# Patient Record
Sex: Female | Born: 2000 | Race: White | Hispanic: No | Marital: Single | Smoking: Current every day smoker
Health system: Southern US, Community
[De-identification: ages and names within clinical notes are randomized; demographics above are authoritative.]

## PROBLEM LIST (undated history)

## (undated) ENCOUNTER — Emergency Department: Payer: No Typology Code available for payment source

## (undated) DIAGNOSIS — D649 Anemia, unspecified: Secondary | ICD-10-CM

## (undated) DIAGNOSIS — F419 Anxiety disorder, unspecified: Secondary | ICD-10-CM

## (undated) HISTORY — PX: WISDOM TOOTH EXTRACTION: SHX21

## (undated) HISTORY — PX: TONSILLECTOMY: SUR1361

## (undated) HISTORY — DX: Anxiety disorder, unspecified: F41.9

## (undated) HISTORY — PX: TYMPANOSTOMY TUBE PLACEMENT: SHX32

---
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
## 2020-03-20 ENCOUNTER — Other Ambulatory Visit: Payer: Self-pay | Admitting: Family Medicine

## 2020-03-20 NOTE — Telephone Encounter (Signed)
Patient needs refill on her perscription.

## 2020-03-20 NOTE — Telephone Encounter (Signed)
Returned TC to patient. LM with number to call.Burt Knack, RN

## 2020-03-22 NOTE — Telephone Encounter (Signed)
Phone call to pt. Left message on voicemail that RN with [facility] is returning phone call, if still need assistance please call us back at [phone]. °

## 2020-03-24 NOTE — Telephone Encounter (Signed)
Attempted TC to patient.  [facility] if still needs assistance.  Richmond Campbell, RN

## 2020-05-03 ENCOUNTER — Ambulatory Visit: Payer: Self-pay | Admitting: Adult Health

## 2020-05-05 NOTE — Progress Notes (Deleted)
No show

## 2020-05-22 ENCOUNTER — Ambulatory Visit: Payer: Medicaid Other

## 2020-07-04 ENCOUNTER — Emergency Department
Admission: EM | Admit: 2020-07-04 | Discharge: 2020-07-04 | Discharge status: Against Medical Advice | Payer: Medicaid Other

## 2020-07-08 ENCOUNTER — Emergency Department
Admission: EM | Admit: 2020-07-08 | Discharge: 2020-07-08 | Disposition: A | Discharge status: Home / Self Care | Payer: Medicaid Other | Attending: Emergency Medicine | Admitting: Emergency Medicine

## 2020-07-08 ENCOUNTER — Encounter: Payer: Self-pay | Admitting: Emergency Medicine

## 2020-07-08 ENCOUNTER — Other Ambulatory Visit: Payer: Self-pay

## 2020-07-08 ENCOUNTER — Emergency Department: Payer: Medicaid Other

## 2020-07-08 DIAGNOSIS — S53402A Unspecified sprain of left elbow, initial encounter: Secondary | ICD-10-CM | POA: Diagnosis not present

## 2020-07-08 DIAGNOSIS — Y999 Unspecified external cause status: Secondary | ICD-10-CM | POA: Insufficient documentation

## 2020-07-08 DIAGNOSIS — R0981 Nasal congestion: Secondary | ICD-10-CM | POA: Diagnosis not present

## 2020-07-08 DIAGNOSIS — S59902A Unspecified injury of left elbow, initial encounter: Secondary | ICD-10-CM | POA: Diagnosis present

## 2020-07-08 DIAGNOSIS — F17210 Nicotine dependence, cigarettes, uncomplicated: Secondary | ICD-10-CM | POA: Insufficient documentation

## 2020-07-08 DIAGNOSIS — W19XXXA Unspecified fall, initial encounter: Secondary | ICD-10-CM | POA: Diagnosis not present

## 2020-07-08 DIAGNOSIS — Y939 Activity, unspecified: Secondary | ICD-10-CM | POA: Diagnosis not present

## 2020-07-08 DIAGNOSIS — J40 Bronchitis, not specified as acute or chronic: Secondary | ICD-10-CM | POA: Insufficient documentation

## 2020-07-08 DIAGNOSIS — Y929 Unspecified place or not applicable: Secondary | ICD-10-CM | POA: Insufficient documentation

## 2020-07-08 DIAGNOSIS — Z79899 Other long term (current) drug therapy: Secondary | ICD-10-CM | POA: Insufficient documentation

## 2020-07-08 MED ORDER — FLUTICASONE PROPIONATE 50 MCG/ACT NA SUSP: 2.0000 | Freq: Every day | NASAL | 0 refills | Status: DC

## 2020-07-08 MED ORDER — PREDNISONE 20 MG PO TABS: 20.0000 mg | ORAL_TABLET | Freq: Two times a day (BID) | ORAL | 0 refills | Status: AC

## 2020-07-08 MED ORDER — BENZONATATE 100 MG PO CAPS: ORAL_CAPSULE | ORAL | 0 refills | Status: DC

## 2020-07-08 NOTE — ED Notes (Signed)
See triage note. Pt here with nasal congestion and cough. Pt also c/o of left elbow pain after injuring it 2 weeks ago.

## 2020-07-08 NOTE — ED Provider Notes (Signed)
[facility] Emergency Department Provider Note ____________________________________________  Time seen: 1047  I have reviewed the triage vital signs and the nursing notes.  HISTORY  Chief Complaint  Cough, Nasal Congestion, and Elbow Pain  HPI [facility] Petko is a 19 y.o. female presents her self to the ED for evaluation of a 2-week complaint of left elbow pain following a mechanical fall.  She also has a 1 week complaint of sinus congestion  and intermittent cough.  Patient denies any interim fever, chills, or sweats.  She also denies any shortness of breath or loss of taste/smell sensation.  She been taking over-the-counter Robitussin with limited benefit.  Past Medical History:  Diagnosis Date  . Anemia   . Anxiety     Patient Active Problem List   Diagnosis Date Noted  . 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 Depression 11/10/2018  . Migraine without aura and without status migrainosus, not intractable 10/05/2014  . Episodic tension-type headache, not intractable 10/05/2014    History reviewed. No pertinent surgical history.  Prior to Admission medications   Medication Sig Start Date End Date Taking? Authorizing Provider  acetaminophen (TYLENOL) 325 MG tablet Take 325 mg by mouth every 6 (six) hours as needed.    [provider]  acyclovir (ZOVIRAX) 800 MG tablet Take 800 mg by mouth daily.    Matt Holmes, [place]  benzonatate (TESSALON PERLES) 100 MG capsule Take 1-2 tabs TID prn cough 07/08/20   Vera Furniss, Charlesetta Ivory, PA-C  fluticasone (FLONASE) 50 MCG/ACT nasal spray Place 2 sprays into both nostrils daily. 07/08/20   [place] Skalicky, Charlesetta Ivory, PA-C  ibuprofen (ADVIL,MOTRIN) 200 MG tablet Take 200 mg by mouth every 6 (six) hours as needed. Takes 2 tablets as needed    [provider]  Magnesium Oxide 500 MG TABS Take by mouth.    [provider]  Naproxen Sodium 220 MG CAPS Take by mouth. Takes one tablet as needed    [provider]  predniSONE (DELTASONE) 20 MG tablet Take 1 tablet (20 mg total) by mouth 2 (two) times daily with a meal for 5 days. 07/08/20 07/13/20  Najat Olazabal, Charlesetta Ivory, PA-C  riboflavin (VITAMIN B-2) 100 MG TABS tablet Take 100 mg by mouth daily.    [provider]  amitriptyline (ELAVIL) 25 MG tablet Take 1 tablet (25 mg total) by mouth at bedtime. Patient not taking: Reported on 05/27/2018 10/05/14 07/08/20  Keturah Shavers, MD    Allergies Sulfa antibiotics  Family History  Problem Relation Age of Onset  . Alcohol abuse Mother   . Drug abuse Mother   . Alcohol abuse Father   . Drug abuse Father   . Anemia Paternal Grandmother   . Lung cancer Paternal Grandfather     Social History Social History   Tobacco Use  . Smoking status: Current Every Day Smoker    Packs/day: 0.50    Types: Cigarettes    Start date: 09/21/2016  . Smokeless tobacco: Never Used  . Tobacco comment: also uses vape  Substance Use Topics  . Alcohol use: No  . Drug use: No    Review of Systems  Constitutional: Negative for fever. Eyes: Negative for visual changes. ENT: Negative for sore throat.  Reports sinus congestion. Cardiovascular: Negative for chest pain. Respiratory: Negative for shortness of breath.  Reports nonproductive cough. Gastrointestinal: Negative for abdominal pain, vomiting and diarrhea. Genitourinary: Negative for dysuria. Musculoskeletal: Negative for back pain.  Reports left elbow pain. Skin:  Negative for rash. Neurological: Negative for headaches, focal weakness or numbness. ____________________________________________  PHYSICAL EXAM:  VITAL SIGNS: ED Triage Vitals [07/08/20 1003]  Enc Vitals Group     BP 109/82     Pulse Rate 72     Resp 18     Temp 97.6 F (36.4 C)     Temp Source Oral     SpO2 100 %     Weight 150 lb (68 kg)     Height 5\' 5"  (1.651 m)     Head Circumference      Peak Flow      Pain Score 6     Pain Loc      Pain Edu?       Excl. in GC?     Constitutional: Alert and oriented. Well appearing and in no distress. Head: Normocephalic and atraumatic. Eyes: Conjunctivae are normal. PERRL. Normal extraocular movements Ears: Canals clear. TMs intact bilaterally. Nose: No congestion/rhinorrhea/epistaxis. Mouth/Throat: Mucous membranes are moist. No oral lesions.  Cardiovascular: Normal rate, regular rhythm. Normal distal pulses. Respiratory: Normal respiratory effort. No wheezes/rales. Mild rhonchi noted bilaterally  Gastrointestinal: Soft and nontender. No distention. Musculoskeletal: Left elbow with minimal swelling.  No joint effusion is appreciated.  Noted derangement, deformity, or dislocation suspected. Normal full ROM, tender with supination. Normal distal grip strength. Nontender with normal range of motion in all extremities.  Neurologic:  CN II-XII grossly intact. Normal gross sensation. No gross focal neurologic deficits are appreciated. Skin:  Skin is warm, dry and intact. No rash noted. ____________________________________________   RADIOLOGY  DG Left Elbow IMPRESSION: Negative. ____________________________________________  PROCEDURES  Jones-Watson splint Sling  Procedures ____________________________________________  INITIAL IMPRESSION / ASSESSMENT AND PLAN / ED COURSE  Patient with ED evaluation of 2 separate complaints.  She gives a 2-week complaint of continued elbow pain and stiffness following mechanical fall.  She also has some reports of some sinus congestion and mild intermittent cough.  No interim fevers reported.  Patient clinically stable without signs of acute respiratory distress, dehydration, or toxic appearance.  X-rays negative for any acute fracture or dislocation.  Patient is placed in a Jones-Watson wrap and a sling to the elbow for comfort.  She will follow-up with Ortho for continued symptoms.  Return precautions have been reviewed.  Prescriptions for prednisone, Flonase,  Tessalon Perles are provided for her symptom relief.  [facility] was evaluated in Emergency Department on 07/08/2020 for the symptoms described in the history of present illness. She was evaluated in the context of the global COVID-19 pandemic, which necessitated consideration that the patient might be at risk for infection with the SARS-CoV-2 virus that causes COVID-19. Institutional protocols and algorithms that pertain to the evaluation of patients at risk for COVID-19 are in a state of rapid change based on information released by regulatory bodies including the CDC and federal and state organizations. These policies and algorithms were followed during the patient's care in the ED. ____________________________________________  FINAL CLINICAL IMPRESSION(S) / ED DIAGNOSES  Final diagnoses:  Bronchitis  Elbow sprain, left, initial encounter      07/10/2020, PA-C 07/08/20 1215    07/10/20, MD 07/08/20 1351

## 2020-07-08 NOTE — ED Triage Notes (Signed)
Pt presents to ED via POV with c/o cough and nasal congestion x 1 week. Pt states has been taking OTC robitussin with some relief. Pt also states L elbow pain after a fall 2 weeks ago, c/o continued pain at this time.

## 2020-07-08 NOTE — ED Notes (Signed)
Declined discharge vital signs. 

## 2020-07-08 NOTE — Discharge Instructions (Addendum)
Your exam and x-ray consistent with a likely bronchitis and an elbow sprain.  No x-ray evidence of a fracture to the elbow.  Wear the elbow support as needed for comfort.  Apply ice as needed.  Take the prescription steroid as needed for cough and elbow inflammation.  Follow-up with Ortho for ongoing symptoms.  Select and see a new primary care provider.  Return as needed.

## 2020-11-20 ENCOUNTER — Emergency Department
Admission: EM | Admit: 2020-11-20 | Discharge: 2020-11-20 | Disposition: A | Discharge status: Home / Self Care | Payer: Medicaid Other | Attending: Student in an Organized Health Care Education/Training Program | Admitting: Student in an Organized Health Care Education/Training Program

## 2020-11-20 ENCOUNTER — Other Ambulatory Visit: Payer: Self-pay

## 2020-11-20 DIAGNOSIS — F17210 Nicotine dependence, cigarettes, uncomplicated: Secondary | ICD-10-CM | POA: Diagnosis not present

## 2020-11-20 DIAGNOSIS — Z20822 Contact with and (suspected) exposure to covid-19: Secondary | ICD-10-CM | POA: Diagnosis not present

## 2020-11-20 DIAGNOSIS — J069 Acute upper respiratory infection, unspecified: Secondary | ICD-10-CM | POA: Insufficient documentation

## 2020-11-20 DIAGNOSIS — R059 Cough, unspecified: Secondary | ICD-10-CM | POA: Diagnosis present

## 2020-11-20 LAB — RESP PANEL BY RT-PCR (FLU A&B, COVID) ARPGX2
Influenza A by PCR: NEGATIVE
Influenza B by PCR: NEGATIVE
SARS Coronavirus 2 by RT PCR: NEGATIVE

## 2020-11-20 MED ORDER — BENZONATATE 100 MG PO CAPS: 100.0000 mg | ORAL_CAPSULE | Freq: Four times a day (QID) | ORAL | 0 refills | Status: AC | PRN

## 2020-11-20 MED ORDER — AMOXICILLIN 875 MG PO TABS: 875.0000 mg | ORAL_TABLET | Freq: Two times a day (BID) | ORAL | 0 refills | Status: DC

## 2020-11-20 NOTE — ED Triage Notes (Signed)
Pt via POV from home. Pt states she has been sick the past two weeks. Pt c/o nasal congestion, intermittent headache, and productive cough. Pt is A&Ox4 and NAD.

## 2020-11-20 NOTE — Discharge Instructions (Addendum)
Follow-up with your regular doctor if not improving in 2 to 3 days.  Return emergency department worsening.  Your Covid test should result later tonight and we will call you with the results.  If negative you may continue with your normal activities.  If positive you will need to quarantine for 10 to 14 days.  Take your medications as prescribed.  Take over-the-counter Mucinex.

## 2020-11-20 NOTE — ED Provider Notes (Signed)
[facility] Emergency Department Provider Note  ____________________________________________   First MD Initiated Contact with Patient 11/20/20 1633     (approximate)  I have reviewed the triage vital signs and the nursing notes.   HISTORY  Chief Complaint Nasal Congestion and Cough    HPI Shawna Griffin is a 19 y.o. female presents emergency department with 2 weeks of cough and congestion.  States nasal mucus has been green.  No chest pain or shortness of breath.  Mild fever.  Has had intermittent headaches.  No vomiting or diarrhea.  No chest pain or shortness of breath.    Past Medical History:  Diagnosis Date  . Anemia   . Anxiety     Patient Active Problem List   Diagnosis Date Noted  . 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 Depression 11/10/2018  . Migraine without aura and without status migrainosus, not intractable 10/05/2014  . Episodic tension-type headache, not intractable 10/05/2014    No past surgical history on file.  Prior to Admission medications   Medication Sig Start Date End Date Taking? Authorizing Provider  acetaminophen (TYLENOL) 325 MG tablet Take 325 mg by mouth every 6 (six) hours as needed.    [provider]  acyclovir (ZOVIRAX) 800 MG tablet Take 800 mg by mouth daily.    Matt Holmes, PA  amoxicillin (AMOXIL) 875 MG tablet Take 1 tablet (875 mg total) by mouth 2 (two) times daily. 11/20/20   Kearney Evitt, Roselyn Bering, PA-C  benzonatate (TESSALON PERLES) 100 MG capsule Take 1 capsule (100 mg total) by mouth every 6 (six) hours as needed for cough. 11/20/20 11/20/21  Sherrie Mustache, Roselyn Bering, PA-C  fluticasone (FLONASE) 50 MCG/ACT nasal spray Place 2 sprays into both nostrils daily. 07/08/20   Menshew, Charlesetta Ivory, PA-C  ibuprofen (ADVIL,MOTRIN) 200 MG tablet Take 200 mg by mouth every 6 (six) hours as needed. Takes 2 tablets as needed    [provider]  Magnesium Oxide 500 MG TABS Take by mouth.    [provider]  Naproxen Sodium 220 MG CAPS Take by mouth. Takes one tablet as needed    [provider]  riboflavin (VITAMIN B-2) 100 MG TABS tablet Take 100 mg by mouth daily.    [provider]  amitriptyline (ELAVIL) 25 MG tablet Take 1 tablet (25 mg total) by mouth at bedtime. Patient not taking: Reported on 05/27/2018 10/05/14 07/08/20  Keturah Shavers, MD    Allergies Sulfa antibiotics  Family History  Problem Relation Age of Onset  . Alcohol abuse Mother   . Drug abuse Mother   . Alcohol abuse Father   . Drug abuse Father   . Anemia Paternal Grandmother   . Lung cancer Paternal Grandfather     Social History Social History   Tobacco Use  . Smoking status: Current Every Day Smoker    Packs/day: 0.50    Types: Cigarettes    Start date: 09/21/2016  . Smokeless tobacco: Never Used  . Tobacco comment: also uses vape  Substance Use Topics  . Alcohol use: No  . Drug use: No    Review of Systems  Constitutional: No fever/chills Eyes: No visual changes. ENT: No sore throat. Respiratory: Positive cough Cardiovascular: Denies chest pain Gastrointestinal: Denies abdominal pain Genitourinary: Negative for dysuria. Musculoskeletal: Negative for back pain. Skin: Negative for rash. Psychiatric: no mood changes,     ____________________________________________   PHYSICAL EXAM:  VITAL SIGNS: ED Triage Vitals  Enc Vitals Group  BP 11/20/20 1549 (!) 123/49     Pulse Rate 11/20/20 1549 99     Resp 11/20/20 1549 20     Temp 11/20/20 1549 97.7 F (36.5 C)     Temp Source 11/20/20 1549 Oral     SpO2 11/20/20 1549 100 %     Weight 11/20/20 1547 135 lb (61.2 kg)     Height 11/20/20 1547 5\' 5"  (1.651 m)     Head Circumference --      Peak Flow --      Pain Score 11/20/20 1547 0     Pain Loc --      Pain Edu? --      Excl. in GC? --     Constitutional: Alert and oriented. Well appearing and in no acute distress. Eyes: Conjunctivae are normal.    Head: Atraumatic. Nose: Active congestion/rhinnorhea. Mouth/Throat: Mucous membranes are moist.   Neck:  supple no lymphadenopathy noted Cardiovascular: Normal rate, regular rhythm. Heart sounds are normal Respiratory: Normal respiratory effort.  No retractions, lungs c t a  GU: deferred Musculoskeletal: FROM all extremities, warm and well perfused Neurologic:  Normal speech and language.  Skin:  Skin is warm, dry and intact. No rash noted. Psychiatric: Mood and affect are normal. Speech and behavior are normal.  ____________________________________________   LABS (all labs ordered are listed, but only abnormal results are displayed)  Labs Reviewed  RESP PANEL BY RT-PCR (FLU A&B, COVID) [id number]   ____________________________________________   ____________________________________________  RADIOLOGY    ____________________________________________   PROCEDURES  Procedure(s) performed: No  Procedures    ____________________________________________   INITIAL IMPRESSION / ASSESSMENT AND PLAN / ED COURSE  Pertinent labs & imaging results that were available during my care of the patient were reviewed by me and considered in my medical decision making (see chart for details).   Patient is 19 year old presents with URI symptoms.  See HPI.  Physical exam shows patient to appear stable.  Some congestion.  But lungs are clear.  Explained findings to the patient.  We have to consider Covid due to her symptoms.  Will order Covid test.  Also, patient has more of a sinusitis/URI.  She was started on amoxicillin and Tessalon Perles.  Take over-the-counter Mucinex.  Return emergency department worsening.  Patient states she needs her Covid test for her court date.  Explained to her we will call her with her test results later today and if she needs copy of the results she can either get it from my chart or we could print her out a copy.     ----------------------------------------- 6:16 PM on 11/20/2020 -----------------------------------------  I tried to notify the patient via her phone number and her next of kin's phone number.  However both phone numbers are incorrect in our system or are out of service.  We will have to mail her a copy or wait till she calls for her results.  Covid test results were negative  Shawna Griffin was evaluated in Emergency Department on 11/20/2020 for the symptoms described in the history of present illness. She was evaluated in the context of the global COVID-19 pandemic, which necessitated consideration that the patient might be at risk for infection with the SARS-CoV-2 virus that causes COVID-19. Institutional protocols and algorithms that pertain to the evaluation of patients at risk for COVID-19 are in a state of rapid change based on information released by regulatory bodies including the CDC and federal and state organizations. These policies and algorithms were  followed during the patient's care in the ED.    As part of my medical decision making, I reviewed the following data within the electronic MEDICAL RECORD NUMBER Nursing notes reviewed and incorporated, Labs reviewed , Old chart reviewed, Notes from prior ED visits and [place] Controlled Substance Database  ____________________________________________   FINAL CLINICAL IMPRESSION(S) / ED DIAGNOSES  Final diagnoses:  Acute URI  Suspected COVID-19 virus infection      NEW MEDICATIONS STARTED DURING THIS VISIT:  Discharge Medication List as of 11/20/2020  4:42 PM    START taking these medications   Details  amoxicillin (AMOXIL) 875 MG tablet Take 1 tablet (875 mg total) by mouth 2 (two) times daily., Starting Mon 11/20/2020, Normal         Note:  This document was prepared using Dragon voice recognition software and may include unintentional dictation errors.    Faythe Ghee, PA-C 11/20/20 1817    Willy Eddy,  MD 11/20/20 [phone]

## 2021-03-07 ENCOUNTER — Telehealth: Payer: Self-pay | Admitting: Family Medicine

## 2021-03-07 NOTE — Telephone Encounter (Signed)
I need my script refilled. Please call me so I can  Tell you about my problem

## 2021-03-07 NOTE — Telephone Encounter (Signed)
Phone call to pt. Left message on voicemail that RN with [facility] is returning phone call. In general, we should see a pt every year to continue to write prescriptions for medications, we need to update medical hx, current meds, etc, does not mean we have to do testing. It looks like we saw her last 09/2019. Our appt line number is [phone] or you can also ask to speak to RN, also left [phone] number.

## 2021-03-07 NOTE — Telephone Encounter (Signed)
See previous phone note started on 03/07/21.

## 2021-03-07 NOTE — Telephone Encounter (Signed)
Pt was returning missed call from nurse in regards to previous visit.

## 2021-03-07 NOTE — Telephone Encounter (Signed)
Pt called back and another phone message started for RN under today's date.  Phone call to pt. Pt states she is not currently having an outbreak or any issues. At one time she was taking suppressive therapy for HSV but slacked off. She would like to start the medication again. Due to pt work schedule and pt preference, scheduled for 03/15/21 appt; only needs Rx for HSV medication, no complaints, no other services needed.

## 2021-03-15 ENCOUNTER — Ambulatory Visit: Payer: Self-pay

## 2021-05-22 ENCOUNTER — Encounter: Payer: Medicaid Other | Admitting: Obstetrics and Gynecology

## 2021-06-06 ENCOUNTER — Encounter: Payer: Self-pay | Admitting: Obstetrics and Gynecology

## 2021-06-14 IMAGING — DX DG ELBOW COMPLETE 3+V*L*
4 series · 4 of 4 positions shown · non-contrast
Comparison: None.

CLINICAL DATA: Fall 2 weeks ago with left elbow pain.

EXAM:
LEFT ELBOW - COMPLETE 3+ VIEW

[elbow ap]
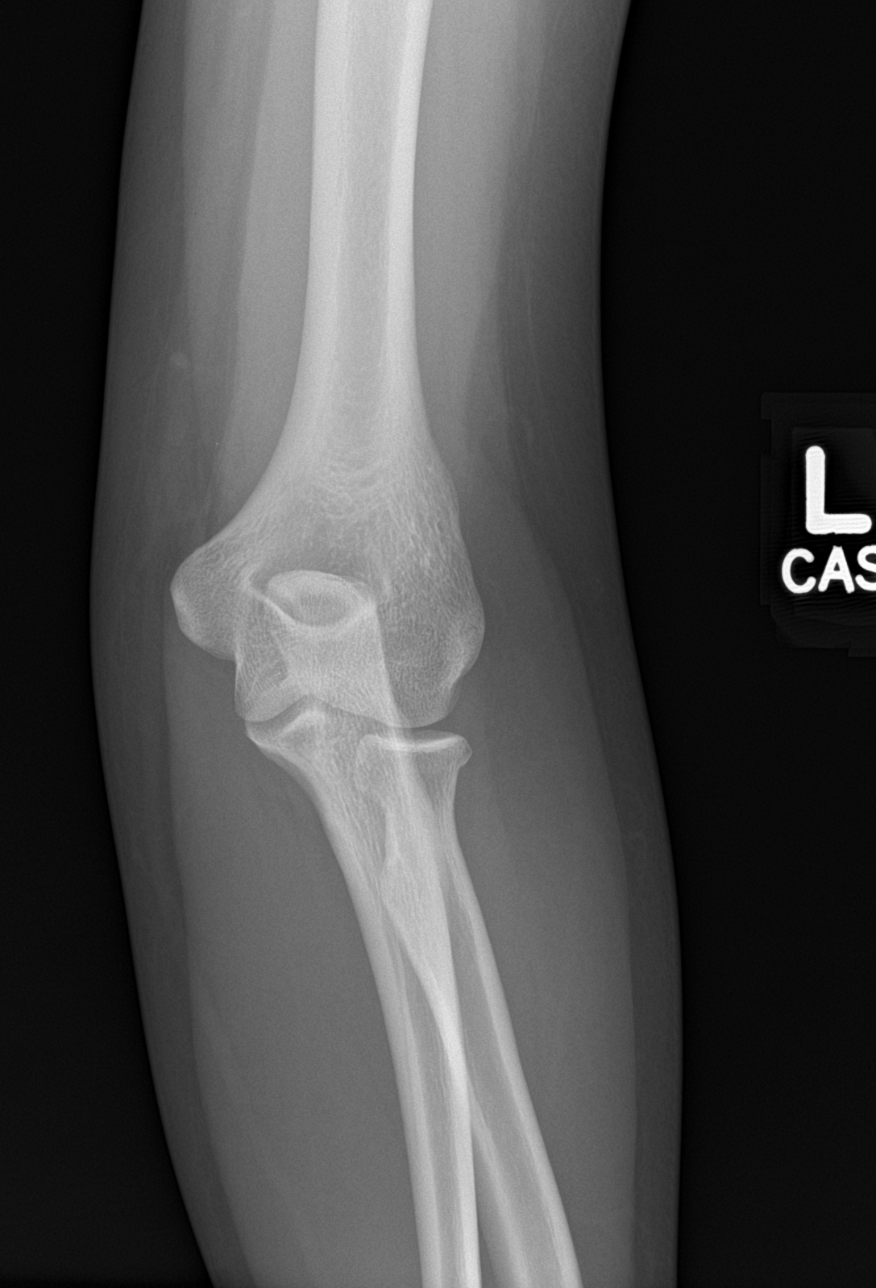

[elbow obl (1 of 2)]
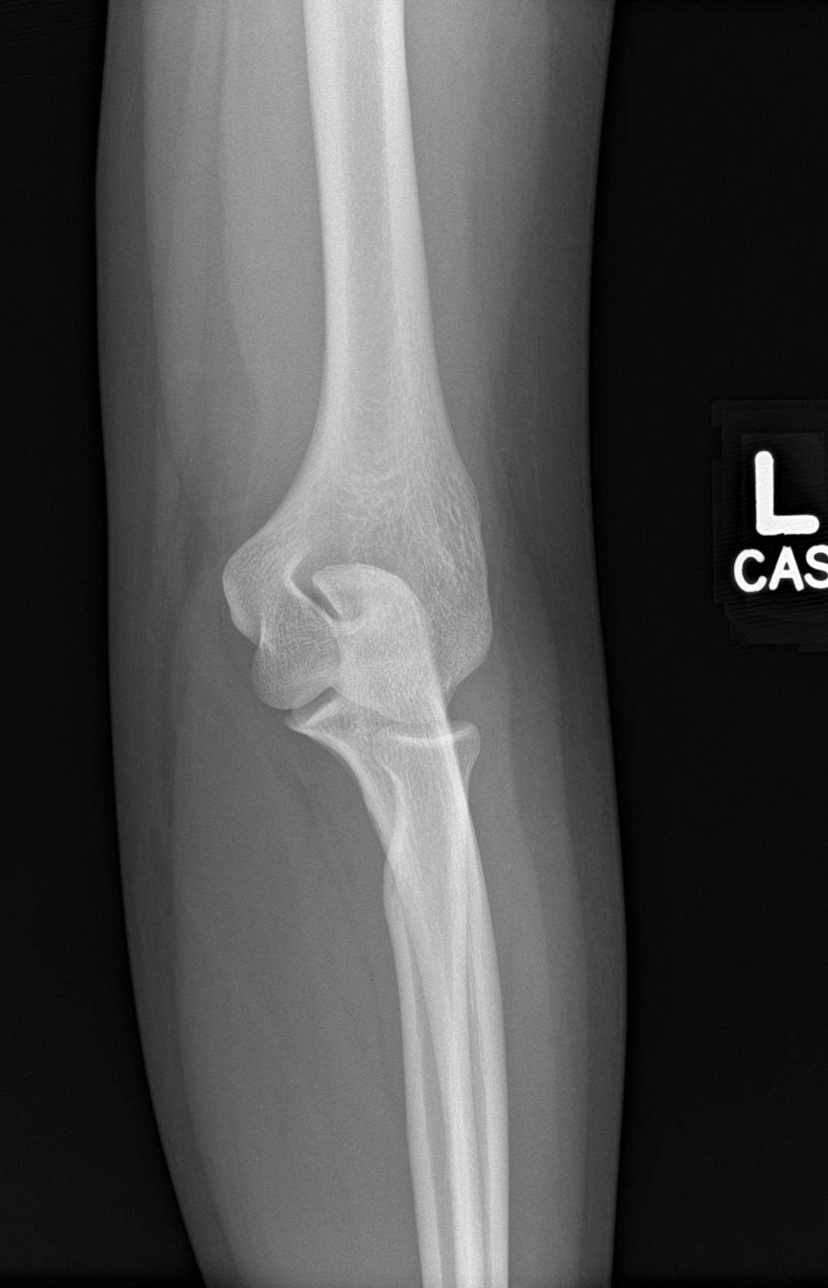

[elbow obl (2 of 2)]
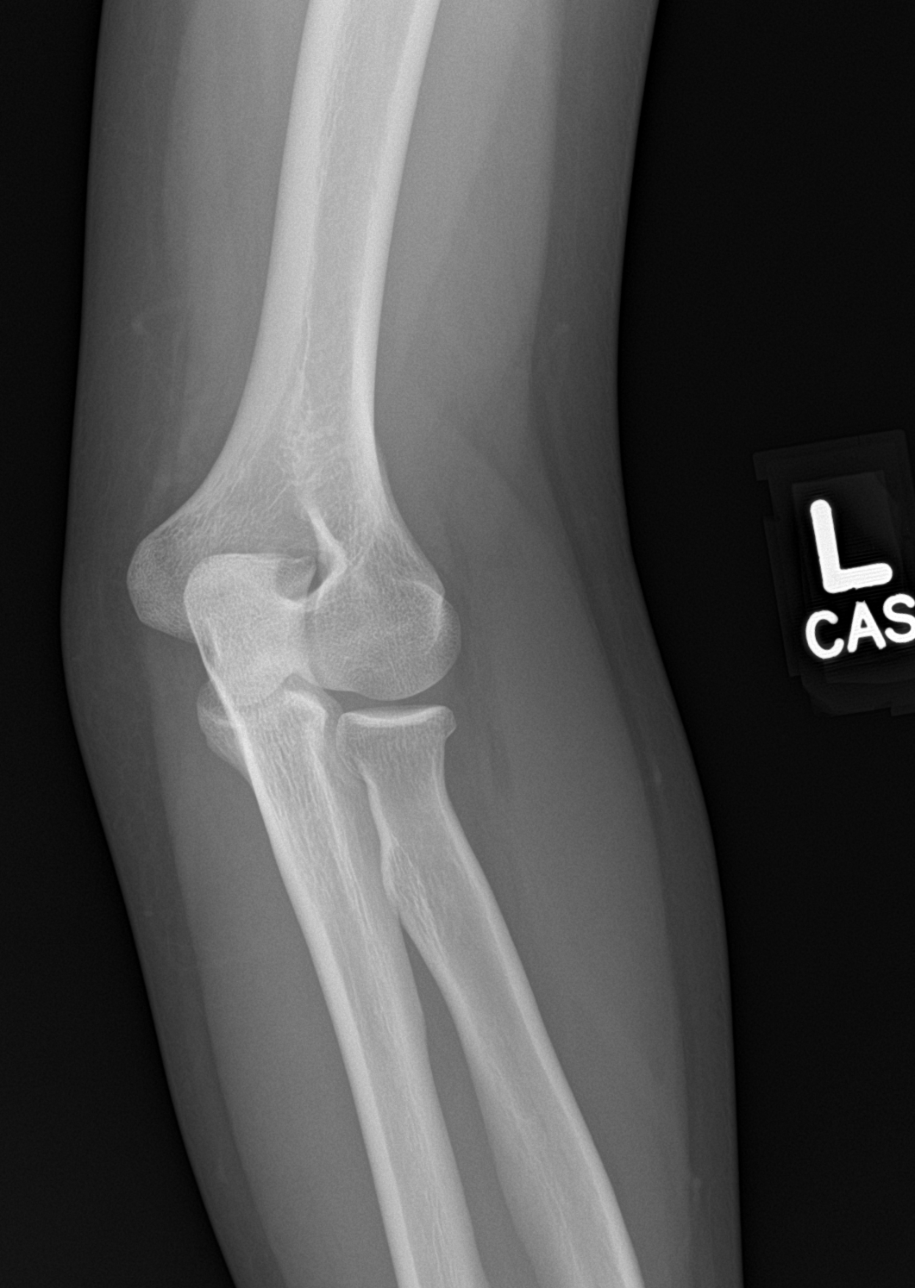

[elbow lat]
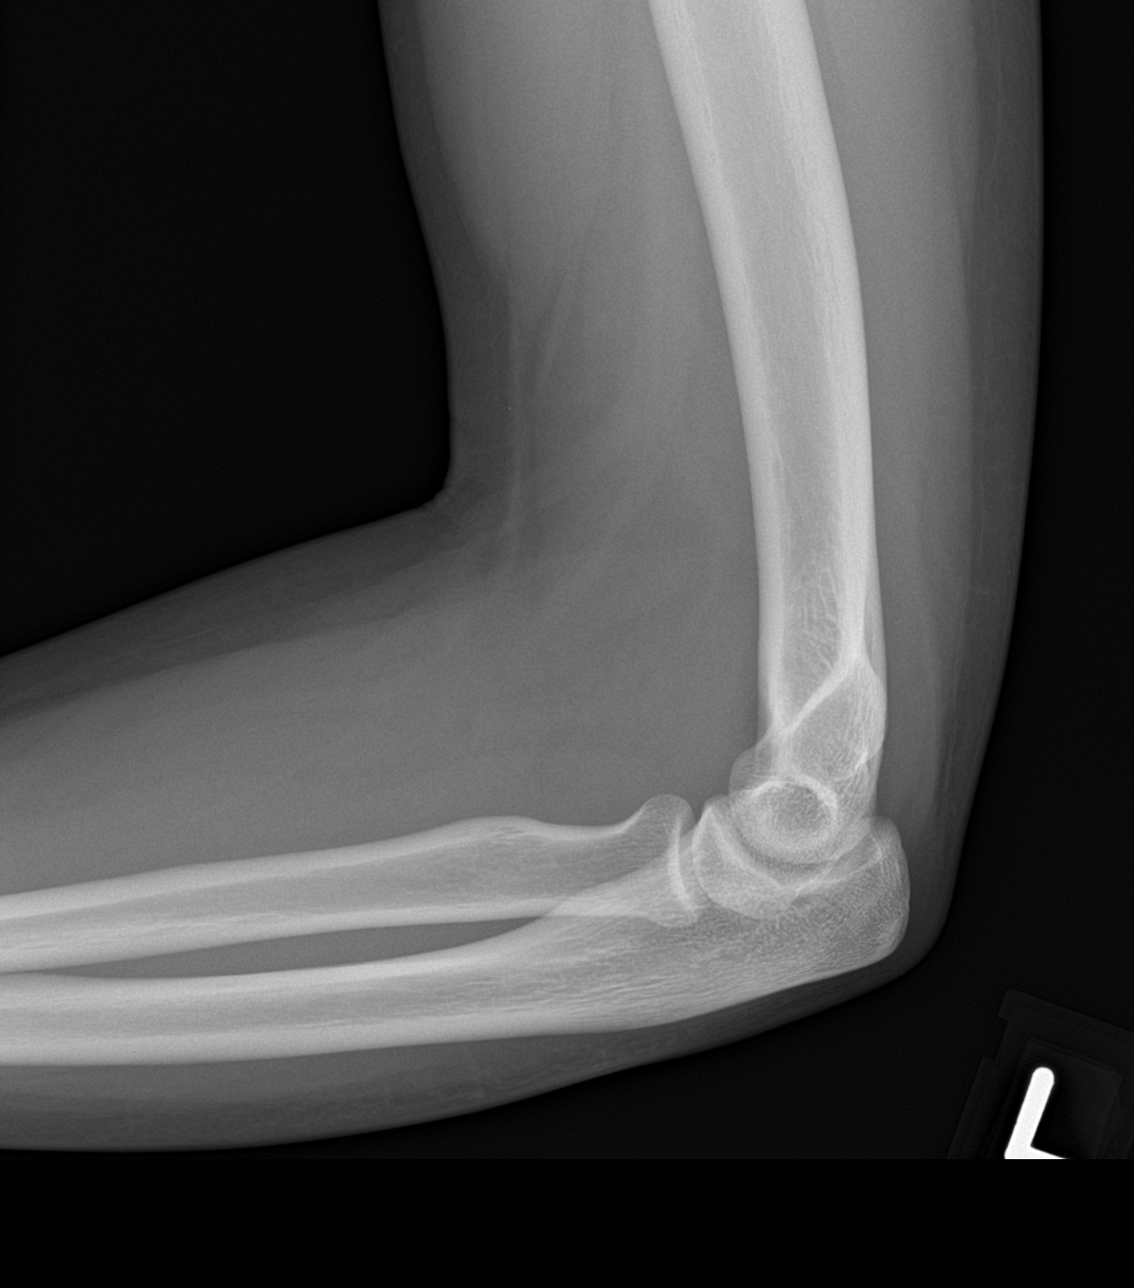

[4 of 4 positions shown; findings below may reference images not displayed]

FINDINGS: There is no evidence of fracture, dislocation, or joint effusion.
There is no evidence of arthropathy or other focal bone abnormality.
Soft tissues are unremarkable.
IMPRESSION: Negative.

## 2022-10-17 DIAGNOSIS — M79671 Pain in right foot: Secondary | ICD-10-CM | POA: Diagnosis present

## 2022-10-17 DIAGNOSIS — M79672 Pain in left foot: Secondary | ICD-10-CM | POA: Diagnosis not present

## 2022-10-18 ENCOUNTER — Emergency Department: Payer: Medicaid Other

## 2022-10-18 ENCOUNTER — Emergency Department
Admission: EM | Admit: 2022-10-18 | Discharge: 2022-10-18 | Disposition: A | Discharge status: Home / Self Care | Payer: Medicaid Other | Attending: Emergency Medicine | Admitting: Emergency Medicine

## 2022-10-18 ENCOUNTER — Encounter: Payer: Self-pay | Admitting: Emergency Medicine

## 2022-10-18 ENCOUNTER — Other Ambulatory Visit: Payer: Self-pay

## 2022-10-18 DIAGNOSIS — M79671 Pain in right foot: Secondary | ICD-10-CM

## 2022-10-18 LAB — BASIC METABOLIC PANEL
Anion gap: 8 (ref 5–15)
BUN: 12 mg/dL (ref 6–20)
CO2: 26 mmol/L (ref 22–32)
Calcium: 9.1 mg/dL (ref 8.9–10.3)
Chloride: 104 mmol/L (ref 98–111)
Creatinine, Ser: 0.86 mg/dL (ref 0.44–1.00)
GFR, Estimated: >60 mL/min (ref >60)
Glucose, Bld: 106 mg/dL — ABNORMAL HIGH (ref 70–99)
Potassium: 3.3 mmol/L — ABNORMAL LOW (ref 3.5–5.1)
Sodium: 138 mmol/L (ref 135–145)

## 2022-10-18 LAB — CBC WITH DIFFERENTIAL/PLATELET
Abs Immature Granulocytes: 0.03 10*3/uL (ref 0.00–0.07)
Basophils Absolute: 0.1 10*3/uL (ref 0.0–0.1)
Basophils Relative: 1 %
Eosinophils Absolute: 0.1 10*3/uL (ref 0.0–0.5)
Eosinophils Relative: 1 %
HCT: 35.8 % — ABNORMAL LOW (ref 36.0–46.0)
Hemoglobin: 11.7 g/dL — ABNORMAL LOW (ref 12.0–15.0)
Immature Granulocytes: 0 %
Lymphocytes Relative: 30 %
Lymphs Abs: 2.9 10*3/uL (ref 0.7–4.0)
MCH: 30.2 pg (ref 26.0–34.0)
MCHC: 32.7 g/dL (ref 30.0–36.0)
MCV: 92.3 fL (ref 80.0–100.0)
Monocytes Absolute: 0.7 10*3/uL (ref 0.1–1.0)
Monocytes Relative: 7 %
Neutro Abs: 6 10*3/uL (ref 1.7–7.7)
Neutrophils Relative %: 61 %
Platelets: 374 10*3/uL (ref 150–400)
RBC: 3.88 MIL/uL (ref 3.87–5.11)
RDW: 12.6 % (ref 11.5–15.5)
WBC: 9.8 10*3/uL (ref 4.0–10.5)
nRBC: 0 % (ref 0.0–0.2)

## 2022-10-18 MED ORDER — DOXYCYCLINE HYCLATE 100 MG PO CAPS: 100.0000 mg | ORAL_CAPSULE | Freq: Two times a day (BID) | ORAL | 0 refills | Status: AC

## 2022-10-18 NOTE — Discharge Instructions (Signed)
Please seek medical attention for any high fevers, chest pain, shortness of breath, change in behavior, persistent vomiting, bloody stool or any other new or concerning symptoms.  

## 2022-10-18 NOTE — ED Notes (Signed)
Pt Dc to home. Dc instructions reviewed with all questions answered. Pt verbalizes understanding. Pt ambulatory out of dept with steady gait 

## 2022-10-18 NOTE — ED Provider Notes (Signed)
[facility] Provider Note    Event Date/Time   First MD Initiated Contact with Patient 10/18/22 [phone]     (approximate)   History   Foot Pain   HPI  Shawna Griffin is a 21 y.o. female  who presents to the emergency department today because of concern for right leg swelling, pain and foot pain. She states that her feet have been giving her a problem for roughly 1 year. It started around a year ago when the patient states she was forced to walk barefoot on gravel in the cold for a long time. The patient states that she has been having pain to her feet since then, however over the past couple of weeks the pain has gotten worse. Additionally she has developed swelling to her right leg.        Physical Exam   Triage Vital Signs: ED Triage Vitals [10/18/22 0019]  Enc Vitals Group     BP 104/77     Pulse Rate 99     Resp 18     Temp 98.4 F (36.9 C)     Temp Source Oral     SpO2 99 %     Weight 155 lb (70.3 kg)     Height 5\' 5"  (1.651 m)     Head Circumference      Peak Flow      Pain Score 10     Pain Loc      Pain Edu?      Excl. in [place]?     Most recent vital signs: Vitals:   10/18/22 0019  BP: 104/77  Pulse: 99  Resp: 18  Temp: 98.4 F (36.9 C)  SpO2: 99%   General: Awake, alert, oriented. CV:  Good peripheral perfusion. Regular rate and rhythm. Resp:  Normal effort.  Abd:  No distention.  Other:  Right lower leg with some swelling and erythema. Warmth. Bilateral DP 2+. Some peeling of skin on all toes.    ED Results / Procedures / Treatments   Labs (all labs ordered are listed, but only abnormal results are displayed) Labs Reviewed  CBC WITH DIFFERENTIAL/PLATELET - Abnormal; Notable for the following components:      Result Value   Hemoglobin 11.7 (*)    HCT 35.8 (*)    All other components within normal limits  BASIC METABOLIC PANEL - Abnormal; Notable for the following components:   Potassium 3.3 (*)    Glucose, Bld  106 (*)    All other components within normal limits     EKG  None   RADIOLOGY I independently interpreted and visualized the Right foot. My interpretation: no acute abnormality Radiology interpretation:  IMPRESSION:  Negative   I independently interpreted and visualized the Left foot. My interpretation: no acute abnormality Radiology interpretation:  IMPRESSION:  Negative.   US venous lower right Radiology interpretation:  IMPRESSION:  Negative.        PROCEDURES:  Critical Care performed: No  Procedures   MEDICATIONS ORDERED IN ED: Medications - No data to display   IMPRESSION / MDM / [place] / ED COURSE  I reviewed the triage vital signs and the nursing notes.                              Differential diagnosis includes, but is not limited to, vascular disease, diabetes, cellulitis.   Patient's presentation is most consistent with acute  presentation with potential threat to life or bodily function.  Patient presented to the emergency department today because of concern for bilateral foot pain and right lower leg swelling and erythema. On exam patient has 2+ DP bilaterally. Do think there is likely cellulitis to the right lower leg. Will plan on discharging with antibiotics. Additionally will give patient podiatry follow up information.   FINAL CLINICAL IMPRESSION(S) / ED DIAGNOSES   Final diagnoses:  Foot pain, bilateral    Note:  This document was prepared using Dragon voice recognition software and may include unintentional dictation errors.    Nance Pear, MD 10/18/22 [phone]

## 2022-10-18 NOTE — ED Triage Notes (Signed)
Pt to ED from home c/o right leg pain, bilateral foot pain and redness for months.  States feels like she has fever in her toes, toes appear red and peeling, pain is tingling burning.

## 2022-10-19 ENCOUNTER — Emergency Department
Admission: EM | Admit: 2022-10-19 | Discharge: 2022-10-21 | Disposition: A | Discharge status: Home / Self Care | Payer: No Typology Code available for payment source | Attending: Emergency Medicine | Admitting: Emergency Medicine

## 2022-10-19 ENCOUNTER — Other Ambulatory Visit: Payer: Self-pay

## 2022-10-19 ENCOUNTER — Encounter: Payer: Self-pay | Admitting: Emergency Medicine

## 2022-10-19 DIAGNOSIS — Z20822 Contact with and (suspected) exposure to covid-19: Secondary | ICD-10-CM | POA: Insufficient documentation

## 2022-10-19 DIAGNOSIS — Z046 Encounter for general psychiatric examination, requested by authority: Secondary | ICD-10-CM | POA: Diagnosis present

## 2022-10-19 DIAGNOSIS — F332 Major depressive disorder, recurrent severe without psychotic features: Secondary | ICD-10-CM | POA: Insufficient documentation

## 2022-10-19 DIAGNOSIS — F1910 Other psychoactive substance abuse, uncomplicated: Secondary | ICD-10-CM | POA: Diagnosis not present

## 2022-10-19 DIAGNOSIS — R45851 Suicidal ideations: Secondary | ICD-10-CM | POA: Insufficient documentation

## 2022-10-19 LAB — CBC
HCT: 37.2 % (ref 36.0–46.0)
Hemoglobin: 12.5 g/dL (ref 12.0–15.0)
MCH: 30.6 pg (ref 26.0–34.0)
MCHC: 33.6 g/dL (ref 30.0–36.0)
MCV: 91 fL (ref 80.0–100.0)
Platelets: 472 10*3/uL — ABNORMAL HIGH (ref 150–400)
RBC: 4.09 MIL/uL (ref 3.87–5.11)
RDW: 12.2 % (ref 11.5–15.5)
WBC: 13.7 10*3/uL — ABNORMAL HIGH (ref 4.0–10.5)
nRBC: 0 % (ref 0.0–0.2)

## 2022-10-19 LAB — URINE DRUG SCREEN, QUALITATIVE (ARMC ONLY)
Amphetamines, Ur Screen: POSITIVE — AB
Barbiturates, Ur Screen: NOT DETECTED
Benzodiazepine, Ur Scrn: POSITIVE — AB
Cannabinoid 50 Ng, Ur ~~LOC~~: POSITIVE — AB
Cocaine Metabolite,Ur ~~LOC~~: NOT DETECTED
MDMA (Ecstasy)Ur Screen: NOT DETECTED
Methadone Scn, Ur: NOT DETECTED
Opiate, Ur Screen: NOT DETECTED
Phencyclidine (PCP) Ur S: NOT DETECTED
Tricyclic, Ur Screen: NOT DETECTED

## 2022-10-19 LAB — COMPREHENSIVE METABOLIC PANEL
ALT: 25 U/L (ref 0–44)
AST: 42 U/L — ABNORMAL HIGH (ref 15–41)
Albumin: 3.8 g/dL (ref 3.5–5.0)
Alkaline Phosphatase: 56 U/L (ref 38–126)
Anion gap: 11 (ref 5–15)
BUN: 19 mg/dL (ref 6–20)
CO2: 24 mmol/L (ref 22–32)
Calcium: 9.5 mg/dL (ref 8.9–10.3)
Chloride: 105 mmol/L (ref 98–111)
Creatinine, Ser: 0.92 mg/dL (ref 0.44–1.00)
GFR, Estimated: >60 mL/min (ref >60)
Glucose, Bld: 100 mg/dL — ABNORMAL HIGH (ref 70–99)
Potassium: 4 mmol/L (ref 3.5–5.1)
Sodium: 140 mmol/L (ref 135–145)
Total Bilirubin: 1 mg/dL (ref 0.3–1.2)
Total Protein: 7.5 g/dL (ref 6.5–8.1)

## 2022-10-19 LAB — POC URINE PREG, ED: Preg Test, Ur: NEGATIVE

## 2022-10-19 LAB — ACETAMINOPHEN LEVEL: Acetaminophen (Tylenol), Serum: <10 ug/mL — ABNORMAL LOW (ref 10–30)

## 2022-10-19 LAB — ETHANOL: Alcohol, Ethyl (B): <10 mg/dL (ref <10)

## 2022-10-19 LAB — SALICYLATE LEVEL: Salicylate Lvl: <7 mg/dL — ABNORMAL LOW (ref 7.0–30.0)

## 2022-10-19 MED ORDER — LORAZEPAM 2 MG/ML IJ SOLN
2.0000 mg | Freq: Once | INTRAMUSCULAR | Status: AC
  Administered 2022-10-19: 2 mg via INTRAMUSCULAR
  Filled 2022-10-19: qty 1

## 2022-10-19 MED ORDER — DOXYCYCLINE HYCLATE 100 MG PO TABS
100.0000 mg | ORAL_TABLET | Freq: Two times a day (BID) | ORAL | Status: DC
  Administered 2022-10-20 – 2022-10-21 (×3): 100 mg via ORAL
  Filled 2022-10-19 (×4): qty 1

## 2022-10-19 NOTE — ED Notes (Signed)
IVC/pending psych consult 

## 2022-10-19 NOTE — ED Provider Notes (Addendum)
[facility] Provider Note    Event Date/Time   First MD Initiated Contact with Patient 10/19/22 2029     (approximate)   History   IVC   HPI  Shawna Griffin is a 21 y.o. female with a history of polysubstance abuse, anxiety, and anemia who presents under involuntary commitment.  Per the IVC paperwork the patient had sent a text message to her mother stating that she wanted to die.  The patient currently denies active suicidal ideation.  She states that she has been increasingly stressed and anxious.  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  The patient also reports continued pain to her right foot where she was diagnosed with cellulitis.  She states that she has not yet filled the antibiotic that was prescribed at the ED visit yesterday.  I reviewed the past medical records.  The patient was seen in the ED yesterday for the right foot redness and pain and diagnosed with cellulitis.  She was started on doxycycline.  [facility] for a viral syndrome.  She has no recent psychiatric evaluations or hospitalizations.   Physical Exam   Triage Vital Signs: ED Triage Vitals  Enc Vitals Group     BP 10/19/22 2020 (!) 155/108     Pulse Rate 10/19/22 2021 (!) 146     Resp 10/19/22 2020 18     Temp 10/19/22 2020 98 F (36.7 C)     Temp Source 10/19/22 2020 Oral     SpO2 10/19/22 2020 99 %     Weight --      Height --      Head Circumference --      Peak Flow --      Pain Score 10/19/22 2021 0     Pain Loc --      Pain Edu? --      Excl. in [place]? --     Most recent vital signs: Vitals:   10/19/22 2020 10/19/22 2021  BP: (!) 155/108   Pulse:  (!) 146  Resp: 18   Temp: 98 F (36.7 C)   SpO2: 99%      General: Anxious appearing, tearful, restless. CV:  Good peripheral perfusion.  Resp:  Normal effort.  Abd:  No distention.  Other:  Motor intact in all extremities.  Intoxicated  appearing but cooperative.  Right great toe and distal foot with faint erythema and mild induration as well as some peeling of skin.  Full range of motion at all joints.  2+ DP pulse.  Normal cap refill.   ED Results / Procedures / Treatments   Labs (all labs ordered are listed, but only abnormal results are displayed) Labs Reviewed  COMPREHENSIVE METABOLIC PANEL - Abnormal; Notable for the following components:      Result Value   Glucose, Bld 100 (*)    AST 42 (*)    All other components within normal limits  SALICYLATE LEVEL - Abnormal; Notable for the following components:   Salicylate Lvl [id number] (*)    All other components within normal limits  ACETAMINOPHEN LEVEL - Abnormal; Notable for the following components:   Acetaminophen (Tylenol), Serum <10 (*)    All other components within normal limits  CBC - Abnormal; Notable for the following components:   WBC 13.7 (*)    Platelets 472 (*)    All other components within normal limits  URINE DRUG SCREEN, QUALITATIVE [facility]  ONLY) - Abnormal; Notable for the following components:   Amphetamines, Ur Screen POSITIVE (*)    Cannabinoid 50 Ng, Ur [place] POSITIVE (*)    Benzodiazepine, Ur Scrn POSITIVE (*)    All other components within normal limits  SARS CORONAVIRUS 2 BY RT PCR  ETHANOL  POC URINE PREG, ED     EKG     RADIOLOGY    PROCEDURES:  Critical Care performed: No  Procedures   MEDICATIONS ORDERED IN ED: Medications  doxycycline (VIBRA-TABS) tablet 100 mg (has no administration in time range)  LORazepam (ATIVAN) injection 2 mg (2 mg Intramuscular Given 10/19/22 2106)     IMPRESSION / MDM / ASSESSMENT AND PLAN / ED COURSE  I reviewed the triage vital signs and the nursing notes.  21 year old female with PMH as noted above presents under IVC for suicidal ideation although she denies suicidal thoughts currently.  She does endorse substance use and her behavior is consistent with methamphetamine use.  She appears  anxious and agitated and is tachycardic.  Her other vital signs are normal.  Right foot shows similar exam to her ED visit from yesterday consistent with mild cellulitis.  Differential diagnosis includes, but is not limited to, major depression, adjustment disorder, substance-induced mood disorder.  We will obtain psychiatry and TTS consults.  The patient request something for anxiety and agitation and I have ordered Ativan.  We will obtain lab work-up for medical clearance.  Patient's presentation is most consistent with acute presentation with potential threat to life or bodily function.  The patient has been placed in psychiatric observation due to the need to provide a safe environment for the patient while obtaining psychiatric consultation and evaluation, as well as ongoing medical and medication management to treat the patient's condition.  The patient has been placed under full IVC at this time.   ----------------------------------------- 11:31 PM on 10/19/2022 -----------------------------------------  Psychiatry consult is pending.  Lab work-up shows UDS positive for amphetamines, cannabinol's, and benzodiazepines.  Chemistry is normal.  CBC shows mild leukocytosis which is nonspecific.  I have signed the patient out to the oncoming ED physician Dr. Cheri Fowler.   FINAL CLINICAL IMPRESSION(S) / ED DIAGNOSES   Final diagnoses:  Suicidal ideation  Polysubstance abuse ([place])     Rx / DC Orders   ED Discharge Orders     None        Note:  This document was prepared using Dragon voice recognition software and may include unintentional dictation errors.    Arta Silence, MD 10/19/22 4742    Arta Silence, MD 10/19/22 2332

## 2022-10-19 NOTE — BH Assessment (Signed)
Patient is lethargic and is unable to participate in psych assessment.  Psych will continue to follow.

## 2022-10-19 NOTE — ED Triage Notes (Addendum)
Pt states her mom is concerned she will harm herself.  Pt denies saying anything about harming herself.  IVC paperwork states mom received a text after pt was using substances and pt had expressed she didn't want to live.  Pt denies this at time of triage.  Pt does endorse use of heroin and crystal meth.  Pt states she hasn't used since yesterday and believes she can deal with her substance abuse without formal rehab.

## 2022-10-19 NOTE — ED Notes (Addendum)
Pt belongings, 2 chargers, 2 cell phones, slippers, socks, jacket, shorts, bralette/tank, 2 rings, 2 hair ties

## 2022-10-19 NOTE — ED Notes (Signed)
Pt physically hyperactive upon assessment.  Admits to meth and heroine use w/HX of same and "self detoxing" multiple times.  States that she last used meth on Friday. Pt denies SI/HI and states that she does not know why her mother thinks she will hurt herself or why "3 cops showed up" to her home tonight.   States that her mother kicked her out of her home and that she has been living with her aunt across the street and mother wants her to go to rehab but she would rather detox herself as she has in the past. Pt states that she ingests drugs "just to party."  Pt cooperative upon assessment, anxious and fidgety.

## 2022-10-20 DIAGNOSIS — F332 Major depressive disorder, recurrent severe without psychotic features: Secondary | ICD-10-CM

## 2022-10-20 LAB — SARS CORONAVIRUS 2 BY RT PCR: SARS Coronavirus 2 by RT PCR: NEGATIVE

## 2022-10-20 MED ORDER — GABAPENTIN 300 MG PO CAPS
300.0000 mg | ORAL_CAPSULE | Freq: Three times a day (TID) | ORAL | Status: DC
  Administered 2022-10-20 – 2022-10-21 (×3): 300 mg via ORAL
  Filled 2022-10-20 (×3): qty 1

## 2022-10-20 NOTE — ED Notes (Signed)
Patient is IVC pending psych consult 

## 2022-10-20 NOTE — ED Notes (Signed)
TTS saw pt at bedside.

## 2022-10-20 NOTE — ED Notes (Signed)
Pt given nighttime snack. 

## 2022-10-20 NOTE — BH Assessment (Signed)
Referral information for Psychiatric Hospitalization faxed to;   [facility] ([phone]),  [facility] ([phone]),   [facility] ([phone] -or- [phone]),   [facility] ([phone])  [facility] ([phone])  [facility] ([phone]) 

## 2022-10-20 NOTE — ED Notes (Signed)
Pts lunch tray and water at bedside, pt currently sleeping.

## 2022-10-20 NOTE — Consult Note (Signed)
[facility] Face-to-Face Psychiatry Consult   Reason for Consult:  suicide threat through text Referring Physician:  EDP Patient Identification: Shawna Griffin MRN:  [id number] Principal Diagnosis: Major depressive disorder, recurrent severe without psychotic features (HCC) Diagnosis:  Principal Problem:   Major depressive disorder, recurrent severe without psychotic features (HCC)   Total Time spent with patient: 45 minutes  Subjective:   Shawna Griffin Barsanti is a 21 y.o. female patient admitted with suicide threat to her mother via text.  Client forwarded minimal information, per IVC she texted her mother that she wanted to die and had a plan.  Reports high anxiety and depression, isolates, guarded.  Admit based on family's concerns and threats to self.  HPI per EDP on admission: [facility] Shawna Griffin is a 21 y.o. female with a history of polysubstance abuse, anxiety, and anemia who presents under involuntary commitment.  Per the IVC paperwork the patient had sent a text message to her mother stating that she wanted to die.  The patient currently denies active suicidal ideation.  She states that she has been increasingly stressed and anxious.  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  The patient also reports continued pain to her right foot where she was diagnosed with cellulitis.  She states that she has not yet filled the antibiotic that was prescribed at the ED visit yesterday.   Past Psychiatric History: depression, anxiety, meth abuse  Risk to Self:  yes Risk to Others:  none Prior Inpatient Therapy:  none Prior Outpatient Therapy:  none  Past Medical History:  Past Medical History:  Diagnosis Date   Anemia    Anxiety     Past Surgical History:  Procedure Laterality Date   TONSILLECTOMY     TYMPANOSTOMY TUBE PLACEMENT     WISDOM TOOTH EXTRACTION     Family History:  Family History  Problem Relation Age of Onset   Alcohol abuse Mother     Drug abuse Mother    Alcohol abuse Father    Drug abuse Father    Anemia Paternal Grandmother    Lung cancer Paternal Grandfather    Family Psychiatric  History: see above Social History:  Social History   Substance and Sexual Activity  Alcohol Use Yes   Comment: occ.     Social History   Substance and Sexual Activity  Drug Use Yes   Types: IV, Cocaine, Marijuana, Methamphetamines    Social History   Socioeconomic History   Marital status: Single    Spouse name: Not on file   Number of children: Not on file   Years of education: Not on file   Highest education level: Not on file  Occupational History   Not on file  Tobacco Use   Smoking status: Every Day    Packs/day: 0.50    Types: Cigarettes    Start date: 09/21/2016   Smokeless tobacco: Never   Tobacco comments:    also uses vape  Substance and Sexual Activity   Alcohol use: Yes    Comment: occ.   Drug use: Yes    Types: IV, Cocaine, Marijuana, Methamphetamines   Sexual activity: Yes  Other Topics Concern   Not on file  Social History Narrative   Not on file   Social Determinants of Health   Financial Resource Strain: Not on file  Food Insecurity: Not on file  Transportation Needs: Not on file  Physical Activity: Not on file  Stress: Not on file  Social Connections: Not  on file   Additional Social History:    Allergies:   Allergies  Allergen Reactions   Sulfa Antibiotics Shortness Of Breath and Swelling    Labs:  Results for orders placed or performed during the hospital encounter of 10/19/22 (from the past 48 hour(s))  Comprehensive metabolic panel     Status: Abnormal   Collection Time: 10/19/22  8:24 PM  Result Value Ref Range   Sodium 140 135 - 145 mmol/L   Potassium 4.0 3.5 - 5.1 mmol/L   Chloride 105 98 - 111 mmol/L   CO2 24 22 - 32 mmol/L   Glucose, Bld 100 (H) 70 - 99 mg/dL    Comment: Glucose reference range applies only to samples taken after fasting for at least 8 hours.   BUN  19 6 - 20 mg/dL   Creatinine, Ser 9.89 0.44 - 1.00 mg/dL   Calcium 9.5 8.9 - 21.1 mg/dL   Total Protein 7.5 6.5 - 8.1 g/dL   Albumin 3.8 3.5 - 5.0 g/dL   AST 42 (H) 15 - 41 U/L   ALT 25 0 - 44 U/L   Alkaline Phosphatase 56 38 - 126 U/L   Total Bilirubin 1.0 0.3 - 1.2 mg/dL   GFR, Estimated >94 >17 mL/min    Comment: (NOTE) Calculated using the CKD-EPI Creatinine Equation (2021)    Anion gap 11 5 - 15    Comment: Performed at [facility], [address] [place], [place] [address]  Ethanol     Status: None   Collection Time: 10/19/22  8:24 PM  Result Value Ref Range   Alcohol, Ethyl (B) <10 <10 mg/dL    Comment: (NOTE) Lowest detectable limit for serum alcohol is 10 mg/dL.  For medical purposes only. Performed at [facility], [address] [place], [place] [address]   Salicylate level     Status: Abnormal   Collection Time: 10/19/22  8:24 PM  Result Value Ref Range   Salicylate Lvl <7.0 (L) 7.0 - 30.0 mg/dL    Comment: Performed at [facility], [address] [place], [place] [address]  Acetaminophen level     Status: Abnormal   Collection Time: 10/19/22  8:24 PM  Result Value Ref Range   Acetaminophen (Tylenol), Serum <10 (L) 10 - 30 ug/mL    Comment: (NOTE) Therapeutic concentrations vary significantly. A range of 10-30 ug/mL  may be an effective concentration for many patients. However, some  are best treated at concentrations outside of this range. Acetaminophen concentrations >150 ug/mL at 4 hours after ingestion  and >50 ug/mL at 12 hours after ingestion are often associated with  toxic reactions.  Performed at [facility], [address] [place], [place] [address]   cbc     Status: Abnormal   Collection Time: 10/19/22  8:24 PM  Result Value Ref Range   WBC 13.7 (H) 4.0 - 10.5 K/uL   RBC 4.09 3.87 - 5.11 MIL/uL   Hemoglobin 12.5 12.0 - 15.0 g/dL   HCT 78.5 88.5 - 02.7 %   MCV 91.0 80.0 - 100.0 fL   MCH 30.6  26.0 - 34.0 pg   MCHC 33.6 30.0 - 36.0 g/dL   RDW 74.1 28.7 - 86.7 %   Platelets 472 (H) 150 - 400 K/uL   nRBC 0.0 0.0 - 0.2 %    Comment: Performed at [facility], [address], [place], [place] [address]  SARS Coronavirus 2 by RT PCR (hospital order, performed  in [facility] hospital lab) *cepheid single result test* Anterior Nasal Swab     Status: None   Collection Time: 10/19/22  8:36 PM   Specimen: Anterior Nasal Swab  Result Value Ref Range   SARS Coronavirus 2 by RT PCR NEGATIVE NEGATIVE    Comment: Performed at [facility], [address] [place], [place] [address]  Urine Drug Screen, Qualitative     Status: Abnormal   Collection Time: 10/19/22  9:24 PM  Result Value Ref Range   Tricyclic, Ur Screen NONE DETECTED NONE DETECTED   Amphetamines, Ur Screen POSITIVE (A) NONE DETECTED   MDMA (Ecstasy)Ur Screen NONE DETECTED NONE DETECTED   Cocaine Metabolite,Ur [place] NONE DETECTED NONE DETECTED   Opiate, Ur Screen NONE DETECTED NONE DETECTED   Phencyclidine (PCP) Ur S NONE DETECTED NONE DETECTED   Cannabinoid 50 Ng, Ur [place] POSITIVE (A) NONE DETECTED   Barbiturates, Ur Screen NONE DETECTED NONE DETECTED   Benzodiazepine, Ur Scrn POSITIVE (A) NONE DETECTED   Methadone Scn, Ur NONE DETECTED NONE DETECTED    Comment: (NOTE) Tricyclics + metabolites, urine    Cutoff 1000 ng/mL Amphetamines + metabolites, urine  Cutoff 1000 ng/mL MDMA (Ecstasy), urine              Cutoff 500 ng/mL Cocaine Metabolite, urine          Cutoff 300 ng/mL Opiate + metabolites, urine        Cutoff 300 ng/mL Phencyclidine (PCP), urine         Cutoff 25 ng/mL Cannabinoid, urine                 Cutoff 50 ng/mL Barbiturates + metabolites, urine  Cutoff 200 ng/mL Benzodiazepine, urine              Cutoff 200 ng/mL Methadone, urine                   Cutoff 300 ng/mL  The urine drug screen provides only a preliminary, unconfirmed analytical test result and should not be used for  non-medical purposes. Clinical consideration and professional judgment should be applied to any positive drug screen result due to possible interfering substances. A more specific alternate chemical method must be used in order to obtain a confirmed analytical result. Gas chromatography / mass spectrometry (GC/MS) is the preferred confirm atory method. Performed at [facility], [address] [place], [place] [address]   POC urine preg, ED     Status: None   Collection Time: 10/19/22  9:49 PM  Result Value Ref Range   Preg Test, Ur Negative Negative    Current Facility-Administered Medications  Medication Dose Route Frequency Provider Last Rate Last Admin   doxycycline (VIBRA-TABS) tablet 100 mg  100 mg Oral BID Dionne BucySiadecki, Sebastian, MD   100 mg at 10/20/22 [id number]   gabapentin (NEURONTIN) capsule 300 mg  300 mg Oral TID Charm RingsLord, Wayburn Shaler Y, NP       Current Outpatient Medications  Medication Sig Dispense Refill   amphetamine-dextroamphetamine (ADDERALL) 30 MG tablet Take 1 tablet by mouth daily.     doxycycline (VIBRAMYCIN) 100 MG capsule Take 1 capsule (100 mg total) by mouth 2 (two) times daily for 10 days. 20 capsule 0    Musculoskeletal: Strength & Muscle Tone: decreased Gait & Station: normal Patient leans: N/A  Psychiatric Specialty Exam: Physical Exam Vitals and nursing note reviewed.  Constitutional:      Appearance: Normal appearance.  HENT:  Head: Normocephalic.     Nose: Nose normal.  Pulmonary:     Effort: Pulmonary effort is normal.  Musculoskeletal:        General: Normal range of motion.     Cervical back: Normal range of motion.  Neurological:     General: No focal deficit present.     Mental Status: She is alert and oriented to person, place, and time.  Psychiatric:        Attention and Perception: Attention and perception normal.        Mood and Affect: Mood is anxious and depressed.        Speech: Speech normal.        Behavior: Behavior  normal. Behavior is cooperative.        Thought Content: Thought content includes suicidal ideation. Thought content includes suicidal plan.        Cognition and Memory: Cognition and memory normal.        Judgment: Judgment is impulsive.     Review of Systems  Psychiatric/Behavioral:  Positive for depression, substance abuse and suicidal ideas. The patient is nervous/anxious.   All other systems reviewed and are negative.   Blood pressure 115/68, pulse 90, temperature 98.3 F (36.8 C), temperature source Oral, resp. rate 16, last menstrual period 10/04/2022, SpO2 98 %.There is no height or weight on file to calculate BMI.  General Appearance: Disheveled  Eye Contact:  Fair  Speech:  Clear and Coherent  Volume:  Decreased  Mood:  Anxious and Depressed  Affect:  Congruent  Thought Process:  Coherent  Orientation:  Full (Time, Place, and Person)  Thought Content:  Rumination  Suicidal Thoughts:  Yes.  with intent/plan  Homicidal Thoughts:  No  Memory:  Immediate;   Fair Recent;   Fair Remote;   Fair  Judgement:  Poor  Insight:  Lacking  Psychomotor Activity:  Decreased  Concentration:  Concentration: Fair and Attention Span: Fair  Recall:  [employer] of Knowledge:  Fair  Language:  Good  Akathisia:  No  Handed:  Right  AIMS (if indicated):     Assets:  Leisure Time Physical Health Resilience Social Support  ADL's:  Intact  Cognition:  WNL  Sleep:        Physical Exam: Physical Exam Vitals and nursing note reviewed.  Constitutional:      Appearance: Normal appearance.  HENT:     Head: Normocephalic.     Nose: Nose normal.  Pulmonary:     Effort: Pulmonary effort is normal.  Musculoskeletal:        General: Normal range of motion.     Cervical back: Normal range of motion.  Neurological:     General: No focal deficit present.     Mental Status: She is alert and oriented to person, place, and time.  Psychiatric:        Attention and Perception: Attention and  perception normal.        Mood and Affect: Mood is anxious and depressed.        Speech: Speech normal.        Behavior: Behavior normal. Behavior is cooperative.        Thought Content: Thought content includes suicidal ideation. Thought content includes suicidal plan.        Cognition and Memory: Cognition and memory normal.        Judgment: Judgment is impulsive.    Review of Systems  Psychiatric/Behavioral:  Positive for depression, substance abuse and suicidal  ideas. The patient is nervous/anxious.   All other systems reviewed and are negative.  Blood pressure 115/68, pulse 90, temperature 98.3 F (36.8 C), temperature source Oral, resp. rate 16, last menstrual period 10/04/2022, SpO2 98 %. There is no height or weight on file to calculate BMI.  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  Waylan Boga, NP 10/20/2022 2:42 PM

## 2022-10-21 NOTE — ED Notes (Signed)
Hospital meal provided.  100% consumed, pt tolerated w/o complaints.  Waste discarded appropriately.   

## 2022-10-21 NOTE — ED Notes (Signed)
Attempt to call report unsuccessful, no answer from unit after transferred through hospital operator. 

## 2022-10-21 NOTE — ED Notes (Signed)
Pt transferred to [facility] via [place]. Sent transport team with 1 bag of belonging. Pt alert and oriented X4, cooperative, RR even and unlabored, color WNL. Pt in NAD. Pt updated family of transfer with phone this AM.

## 2022-10-21 NOTE — BH Assessment (Signed)
Patient has been accepted to [facility].  Patient assigned to [facility]. Accepting physician is Dr. Elaina Hoops.  Call report to [phone].  Representative was [place].   ER Staff is aware of it:  Apolonio Schneiders, ER Secretary  Dr. Cheri Fowler, ER MD  [facility] Patient's Nurse     Patient can arrive anytime 10/21/22 after 7 AM.

## 2022-10-21 NOTE — ED Notes (Signed)
IVC/Pt accepted to [facility] after 7am on 10/21/22

## 2022-10-21 NOTE — ED Provider Notes (Signed)
Emergency Medicine Observation Re-evaluation Note  [facility] is a 21 y.o. female, seen on rounds today.  Pt initially presented to the ED for complaints of IVC Currently, the patient is transferred to inpatient psychiatric facility.  Physical Exam  BP 100/73   Pulse 85   Temp 97.8 F (36.6 C)   Resp 17   LMP 10/04/2022 (Approximate)   SpO2 100%  Physical Exam General: No distress ED Course / MDM  EKG:   I have reviewed the labs performed to date as well as medications administered while in observation.  Recent changes in the last 24 hours include transferring to inpatient psychiatric facility.  Plan  Current plan is for transfer to inpatient psychiatric facility.    Nathaniel Man, MD 10/21/22 [id number]

## 2022-10-21 NOTE — ED Notes (Signed)
[place]  [facility]  DEPT  CALLED  FOR  TRANSPORT  TO  [facility]
